# Patient Record
Sex: Female | Born: 1970 | Race: Black or African American | Hispanic: No | Smoking: Never smoker
Health system: Southern US, Community
[De-identification: ages and names within clinical notes are randomized; demographics above are authoritative.]

## PROBLEM LIST (undated history)

## (undated) DIAGNOSIS — J45909 Unspecified asthma, uncomplicated: Secondary | ICD-10-CM

## (undated) HISTORY — PX: APPENDECTOMY: SHX54

## (undated) HISTORY — PX: TUBAL LIGATION: SHX77

## (undated) HISTORY — PX: OVARIAN CYST REMOVAL: SHX89

---

## 2015-05-28 ENCOUNTER — Emergency Department: Payer: Medicaid Other

## 2015-05-28 ENCOUNTER — Encounter: Payer: Self-pay | Admitting: Emergency Medicine

## 2015-05-28 ENCOUNTER — Emergency Department
Admission: EM | Admit: 2015-05-28 | Discharge: 2015-05-28 | Disposition: A | Discharge status: Home / Self Care | Payer: Medicaid Other | Attending: Emergency Medicine | Admitting: Emergency Medicine

## 2015-05-28 DIAGNOSIS — Y9389 Activity, other specified: Secondary | ICD-10-CM | POA: Insufficient documentation

## 2015-05-28 DIAGNOSIS — Y998 Other external cause status: Secondary | ICD-10-CM | POA: Insufficient documentation

## 2015-05-28 DIAGNOSIS — S4992XA Unspecified injury of left shoulder and upper arm, initial encounter: Secondary | ICD-10-CM | POA: Insufficient documentation

## 2015-05-28 DIAGNOSIS — R300 Dysuria: Secondary | ICD-10-CM | POA: Insufficient documentation

## 2015-05-28 DIAGNOSIS — S199XXA Unspecified injury of neck, initial encounter: Secondary | ICD-10-CM | POA: Insufficient documentation

## 2015-05-28 DIAGNOSIS — S0990XA Unspecified injury of head, initial encounter: Secondary | ICD-10-CM | POA: Diagnosis present

## 2015-05-28 DIAGNOSIS — F0781 Postconcussional syndrome: Secondary | ICD-10-CM

## 2015-05-28 DIAGNOSIS — S4991XA Unspecified injury of right shoulder and upper arm, initial encounter: Secondary | ICD-10-CM | POA: Insufficient documentation

## 2015-05-28 DIAGNOSIS — Y92410 Unspecified street and highway as the place of occurrence of the external cause: Secondary | ICD-10-CM | POA: Insufficient documentation

## 2015-05-28 DIAGNOSIS — Z3202 Encounter for pregnancy test, result negative: Secondary | ICD-10-CM | POA: Insufficient documentation

## 2015-05-28 DIAGNOSIS — S060X9A Concussion with loss of consciousness of unspecified duration, initial encounter: Secondary | ICD-10-CM | POA: Insufficient documentation

## 2015-05-28 LAB — URINALYSIS COMPLETE WITH MICROSCOPIC (ARMC ONLY)
Bilirubin Urine: NEGATIVE
Glucose, UA: NEGATIVE mg/dL
Hgb urine dipstick: NEGATIVE
Ketones, ur: NEGATIVE mg/dL
Leukocytes, UA: NEGATIVE
Nitrite: NEGATIVE
PH: 6 (ref 5.0–8.0)
PROTEIN: NEGATIVE mg/dL
Specific Gravity, Urine: 1.019 (ref 1.005–1.030)

## 2015-05-28 LAB — POCT PREGNANCY, URINE: PREG TEST UR: NEGATIVE

## 2015-05-28 MED ORDER — CIPROFLOXACIN HCL 500 MG PO TABS
500.0000 mg | ORAL_TABLET | Freq: Two times a day (BID) | ORAL | Status: AC
Start: 2015-05-28 — End: 2015-06-07

## 2015-05-28 MED ORDER — BUTALBITAL-APAP-CAFFEINE 50-325-40 MG PO TABS
2.0000 | ORAL_TABLET | Freq: Once | ORAL | Status: AC
Start: 2015-05-28 — End: 2015-05-28
  Administered 2015-05-28: 2 via ORAL

## 2015-05-28 MED ORDER — PROMETHAZINE HCL 25 MG PO TABS: 25.0000 mg | ORAL_TABLET | Freq: Four times a day (QID) | ORAL | Status: AC | PRN

## 2015-05-28 MED ORDER — BUTALBITAL-APAP-CAFFEINE 50-325-40 MG PO TABS: 1.0000 | ORAL_TABLET | Freq: Four times a day (QID) | ORAL | Status: AC | PRN

## 2015-05-28 MED ORDER — BUTALBITAL-APAP-CAFFEINE 50-325-40 MG PO TABS
ORAL_TABLET | ORAL | Status: AC
Start: 2015-05-28 — End: 2015-05-28
  Administered 2015-05-28: 2 via ORAL
  Filled 2015-05-28: qty 2
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
## 2015-05-28 NOTE — Discharge Instructions (Signed)
Post-Concussion Syndrome Post-concussion syndrome is the symptoms that can occur after a head injury. These symptoms can last from weeks to months. HOME CARE   Take medicines only as told by your doctor.  Do not take aspirin.  Sleep with your head raised to help with headaches.  Avoid activities that can cause another head injury.  Do not play contact sports like football, hockey, soccer, or basketball.  Do not do other risky activities like downhill skiing, martial arts, or horseback riding until your doctor says it is okay.  Keep all follow-up visits as told by your doctor. This is important. GET HELP IF:   You have a harder time:  Paying attention.  Focusing.  Remembering.  Learning new information.  Dealing with stress.  You need more time to complete tasks.  You are easily bothered (irritable).  You have more symptoms. Get help if you have any of these symptoms for more than two weeks after your injury:   Long-lasting (chronic) headaches.  Dizziness.  Trouble balancing.  Feeling sick to your stomach (nauseous).  Trouble with your vision.  Noise or light bothers you more.  Depression.  Mood swings.  Feeling worried (anxious).  Easily bothered.  Memory problems.  Trouble concentrating or paying attention.  Sleep problems.  Feeling tired all of the time. GET HELP RIGHT AWAY IF:  You feel confused.  You feel very sleepy.  You are hard to wake up.  You feel sick to your stomach.  You keep throwing up (vomiting).  You feel like you are moving when you are not (vertigo).  Your eyes move back and forth very quickly.  You start shaking (convulsing) or pass out (faint).  You have very bad headaches that do not get better with medicine.  You cannot use your arms or legs like normal.  One of the black centers of your eyes (pupils) is bigger than the other.  You have clear or bloody fluid coming from your nose or ears.  Your problems  get worse, not better. MAKE SURE YOU:  Understand these instructions.  Will watch your condition.  Will get help right away if you are not doing well or get worse.   This information is not intended to replace advice given to you by your health care provider. Make sure you discuss any questions you have with your health care provider.   Document Released: 06/04/2004 Document Revised: 05/18/2014 Document Reviewed: 08/02/2013 Elsevier Interactive Patient Education 2016 [employer]  General Headache Without Cause A headache is pain or discomfort felt around the head or neck area. There are many causes and types of headaches. In some cases, the cause may not be found.  HOME CARE  Managing Pain  Take over-the-counter and prescription medicines only as told by your doctor.  Lie down in a dark, quiet room when you have a headache.  If directed, apply ice to the head and neck area:  Put ice in a plastic bag.  Place a towel between your skin and the bag.  Leave the ice on for 20 minutes, 2-3 times per day.  Use a heating pad or hot shower to apply heat to the head and neck area as told by your doctor.  Keep lights dim if bright lights bother you or make your headaches worse. Eating and Drinking  Eat meals on a regular schedule.  Lessen how much alcohol you drink.  Lessen how much caffeine you drink, or stop drinking caffeine. General Instructions  Keep all  follow-up visits as told by your doctor. This is important.  Keep a journal to find out if certain things bring on headaches. For example, write down:  What you eat and drink.  How much sleep you get.  Any change to your diet or medicines.  Relax by getting a massage or doing other relaxing activities.  Lessen stress.  Sit up straight. Do not tighten (tense) your muscles.  Do not use tobacco products. This includes cigarettes, chewing tobacco, or e-cigarettes. If you need help quitting, ask your  doctor.  Exercise regularly as told by your doctor.  Get enough sleep. This often means 7-9 hours of sleep. GET HELP IF:  Your symptoms are not helped by medicine.  You have a headache that feels different than the other headaches.  You feel sick to your stomach (nauseous) or you throw up (vomit).  You have a fever. GET HELP RIGHT AWAY IF:   Your headache becomes really bad.  You keep throwing up.  You have a stiff neck.  You have trouble seeing.  You have trouble speaking.  You have pain in the eye or ear.  Your muscles are weak or you lose muscle control.  You lose your balance or have trouble walking.  You feel like you will pass out (faint) or you pass out.  You have confusion.   This information is not intended to replace advice given to you by your health care provider. Make sure you discuss any questions you have with your health care provider.   Document Released: 02/04/2008 Document Revised: 01/16/2015 Document Reviewed: 08/20/2014 Elsevier Interactive Patient Education [employer]
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
## 2015-05-28 NOTE — ED Notes (Signed)
AAOx3.  Skin warm and dry.  Ambulates with easy and steady gait.  Moving all extremities equally and strong. 
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
## 2015-05-30 LAB — URINE CULTURE

## 2015-11-15 ENCOUNTER — Emergency Department
Admission: EM | Admit: 2015-11-15 | Discharge: 2015-11-15 | Disposition: A | Discharge status: Home / Self Care | Payer: No Typology Code available for payment source | Attending: Emergency Medicine | Admitting: Emergency Medicine

## 2015-11-15 ENCOUNTER — Emergency Department: Payer: No Typology Code available for payment source

## 2015-11-15 ENCOUNTER — Encounter: Payer: Self-pay | Admitting: *Deleted

## 2015-11-15 DIAGNOSIS — Y929 Unspecified place or not applicable: Secondary | ICD-10-CM | POA: Insufficient documentation

## 2015-11-15 DIAGNOSIS — Y999 Unspecified external cause status: Secondary | ICD-10-CM | POA: Insufficient documentation

## 2015-11-15 DIAGNOSIS — S9032XA Contusion of left foot, initial encounter: Secondary | ICD-10-CM | POA: Insufficient documentation

## 2015-11-15 DIAGNOSIS — Y9389 Activity, other specified: Secondary | ICD-10-CM | POA: Insufficient documentation

## 2015-11-15 DIAGNOSIS — W208XXA Other cause of strike by thrown, projected or falling object, initial encounter: Secondary | ICD-10-CM | POA: Insufficient documentation

## 2015-11-15 MED ORDER — MELOXICAM 15 MG PO TABS: 15.0000 mg | ORAL_TABLET | Freq: Every day | ORAL | Status: AC

## 2015-11-15 NOTE — ED Notes (Signed)
Pt to ED after dropping large fan on left foot yesterday. Pt ambulatory to room with NAD, pedal pulses present and intact. NAD noted.

## 2015-11-15 NOTE — ED Notes (Signed)
See triage note  States she dropped a fan onto left foot yesterday conts to have pain positive pulses

## 2015-11-15 NOTE — Discharge Instructions (Signed)

## 2015-11-15 NOTE — ED Provider Notes (Signed)
[facility]  ____________________________________________  Time seen: Approximately 5:12 PM  I have reviewed the triage vital signs and the nursing notes.   HISTORY  Chief Complaint Foot Injury    HPI Kathy Brooks is a 45 y.o. female who presents to emergency department complaining of left foot pain. Patient states that she was moving a oscillating fan when it slipped out of foot. Patient reports pain to being dorsal aspect of the foot and the lateral aspect. Patient reports it is extremely painful with weightbearing. Pain is constant, worse with ambulation, moderate to severe. Patient has not tried any medications for this complaint prior to arrival.   History reviewed. No pertinent past medical history.  There are no active problems to display for this patient.   Past Surgical History  Procedure Laterality Date  . Tubal ligation    . Appendectomy    . Ovarian cyst removal      Current Outpatient Rx  Name  Route  Sig  Dispense  Refill  . butalbital-acetaminophen-caffeine (FIORICET) 50-325-40 MG tablet   Oral   Take 1-2 tablets by mouth every 6 (six) hours as needed for headache.   20 tablet   0   . meloxicam (MOBIC) 15 MG tablet   Oral   Take 1 tablet (15 mg total) by mouth daily.   30 tablet   0   . promethazine (PHENERGAN) 25 MG tablet   Oral   Take 1 tablet (25 mg total) by mouth every 6 (six) hours as needed for nausea or vomiting.   15 tablet   0     Allergies Rocephin  History reviewed. No pertinent family history.  Social History Social History  Substance Use Topics  . Smoking status: Never Smoker   . Smokeless tobacco: None  . Alcohol Use: No     Review of Systems  Constitutional: No fever/chills Cardiovascular: no chest pain. Respiratory: no cough. No SOB. Musculoskeletal: Positive for left foot pain Skin: Negative for rash, abrasions, lacerations,  ecchymosis. Neurological: Negative for headaches, focal weakness or numbness. 10-point ROS otherwise negative.  ____________________________________________   PHYSICAL EXAM:  VITAL SIGNS: ED Triage Vitals  Enc Vitals Group     BP 11/15/15 1622 118/75 mmHg     Pulse Rate 11/15/15 1622 71     Resp 11/15/15 1622 18     Temp 11/15/15 1622 98.6 F (37 C)     Temp Source 11/15/15 1622 Oral     SpO2 11/15/15 1622 100 %     Weight 11/15/15 1622 118 lb (53.524 kg)     Height 11/15/15 1622 5\' 3"  (1.6 m)     Head Cir --      Peak Flow --      Pain Score 11/15/15 1622 6     Pain Loc --      Pain Edu? --      Excl. in GC? --      Constitutional: Alert and oriented. Well appearing and in no acute distress. Eyes: Conjunctivae are normal. PERRL. EOMI. Head: Atraumatic. Neck: No stridor.    Cardiovascular: Normal rate, regular rhythm. Normal S1 and S2.  Good peripheral circulation. Respiratory: Normal respiratory effort without tachypnea or retractions. Lungs CTAB. Good air entry to the bases with no decreased or absent breath sounds. Musculoskeletal: Full range of motion to all extremities. No gross deformities appreciated.No visible deformity to left foot on inspection. No ecchymosis or contusions noted. Patient is diffusely tender to palpation  over the lateral tarsal bones of the left foot. No palpable abnormality. Full range of motion all digits of foot. Sensation intact 5 digits of the left foot. Dorsalis pedis pulse intact. Neurologic:  Normal speech and language. No gross focal neurologic deficits are appreciated.  Skin:  Skin is warm, dry and intact. No rash noted. Psychiatric: Mood and affect are normal. Speech and behavior are normal. Patient exhibits appropriate insight and judgement.   ____________________________________________   LABS (all labs ordered are listed, but only abnormal results are displayed)  Labs Reviewed - No data to  display ____________________________________________  EKG   ____________________________________________  RADIOLOGY Festus BarrenI, Jonathan D Cuthriell, personally viewed and evaluated these images (plain radiographs) as part of my medical decision making, as well as reviewing the written report by the radiologist.  Dg Foot Complete Left  11/15/2015  CLINICAL DATA:  Direct trauma to the left foot when a fence fell on it. Patient reports symptoms greatest at the base of the fourth toe. There is swelling clinically. EXAM: LEFT FOOT - COMPLETE 3+ VIEW COMPARISON:  None in PACs FINDINGS: The bones are subjectively adequately mineralized. There is no acute fracture nor dislocation. There is mild interphalangeal joint space narrowing without erosive change. The metatarsals and tarsals are intact. There is a tiny plantar calcaneal spur. The soft tissues are unremarkable. IMPRESSION: There is no acute fracture nor dislocation of the bones of the left foot. Electronically Signed   By: David  Swazil[place] M.D.   On: 11/15/2015 16:58    ____________________________________________    PROCEDURES  Procedure(s) performed:       Medications - No data to display   ____________________________________________   INITIAL IMPRESSION / ASSESSMENT AND PLAN / ED COURSE  Pertinent labs & imaging results that were available during my care of the patient were reviewed by me and considered in my medical decision making (see chart for details).  Patient's diagnosis is consistent with left foot contusion. X-ray reveals no acute osseous abnormality. Patient will be given crutches for an evaluation. Patient will be discharged home with prescriptions for anti-inflammatories for symptom control. Patient is to follow up with primary care provider as needed or otherwise directed. Patient is given ED precautions to return to the ED for any worsening or new symptoms.     ____________________________________________  FINAL  CLINICAL IMPRESSION(S) / ED DIAGNOSES  Final diagnoses:  Foot contusion, left, initial encounter      NEW MEDICATIONS STARTED DURING THIS VISIT:  New Prescriptions   MELOXICAM (MOBIC) 15 MG TABLET    Take 1 tablet (15 mg total) by mouth daily.        This chart was dictated using voice recognition software/Dragon. Despite best efforts to proofread, errors can occur which can change the meaning. Any change was purely unintentional.    Racheal PatchesJonathan D Cuthriell, PA-C 11/15/15 1719  Minna AntisKevin Paduchowski, MD 11/15/15 2325

## 2016-04-29 ENCOUNTER — Encounter: Payer: Self-pay | Admitting: Emergency Medicine

## 2016-04-29 ENCOUNTER — Emergency Department
Admission: EM | Admit: 2016-04-29 | Discharge: 2016-04-29 | Disposition: A | Discharge status: Home / Self Care | Payer: Self-pay | Attending: Emergency Medicine | Admitting: Emergency Medicine

## 2016-04-29 ENCOUNTER — Emergency Department: Payer: Self-pay

## 2016-04-29 DIAGNOSIS — R42 Dizziness and giddiness: Secondary | ICD-10-CM | POA: Insufficient documentation

## 2016-04-29 DIAGNOSIS — J45909 Unspecified asthma, uncomplicated: Secondary | ICD-10-CM | POA: Insufficient documentation

## 2016-04-29 DIAGNOSIS — R112 Nausea with vomiting, unspecified: Secondary | ICD-10-CM | POA: Insufficient documentation

## 2016-04-29 HISTORY — DX: Unspecified asthma, uncomplicated: J45.909

## 2016-04-29 LAB — LIPASE, BLOOD: Lipase: 32 U/L (ref 11–51)

## 2016-04-29 LAB — URINALYSIS, COMPLETE (UACMP) WITH MICROSCOPIC
BACTERIA UA: NONE SEEN
Bilirubin Urine: NEGATIVE
Glucose, UA: NEGATIVE mg/dL
Hgb urine dipstick: NEGATIVE
Ketones, ur: NEGATIVE mg/dL
Leukocytes, UA: NEGATIVE
Nitrite: NEGATIVE
Protein, ur: NEGATIVE mg/dL
SPECIFIC GRAVITY, URINE: 1.023 (ref 1.005–1.030)
pH: 6 (ref 5.0–8.0)

## 2016-04-29 LAB — CBC WITH DIFFERENTIAL/PLATELET
BASOS PCT: 1 %
Basophils Absolute: 0 10*3/uL (ref 0–0.1)
Eosinophils Absolute: 0.1 10*3/uL (ref 0–0.7)
Eosinophils Relative: 3 %
HEMATOCRIT: 34.8 % — AB (ref 35.0–47.0)
Hemoglobin: 11.6 g/dL — ABNORMAL LOW (ref 12.0–16.0)
Lymphocytes Relative: 27 %
Lymphs Abs: 1 10*3/uL (ref 1.0–3.6)
MCH: 28.6 pg (ref 26.0–34.0)
MCHC: 33.3 g/dL (ref 32.0–36.0)
MCV: 85.8 fL (ref 80.0–100.0)
MONO ABS: 0.3 10*3/uL (ref 0.2–0.9)
MONOS PCT: 10 %
NEUTROS ABS: 2.1 10*3/uL (ref 1.4–6.5)
Neutrophils Relative %: 59 %
Platelets: 217 10*3/uL (ref 150–440)
RBC: 4.06 MIL/uL (ref 3.80–5.20)
RDW: 14.2 % (ref 11.5–14.5)
WBC: 3.5 10*3/uL — ABNORMAL LOW (ref 3.6–11.0)

## 2016-04-29 LAB — COMPREHENSIVE METABOLIC PANEL
ALK PHOS: 44 U/L (ref 38–126)
ALT: 9 U/L — AB (ref 14–54)
AST: 18 U/L (ref 15–41)
Albumin: 3.9 g/dL (ref 3.5–5.0)
Anion gap: 4 — ABNORMAL LOW (ref 5–15)
BILIRUBIN TOTAL: 0.3 mg/dL (ref 0.3–1.2)
BUN: 13 mg/dL (ref 6–20)
CALCIUM: 8.9 mg/dL (ref 8.9–10.3)
CHLORIDE: 110 mmol/L (ref 101–111)
CO2: 26 mmol/L (ref 22–32)
CREATININE: 0.7 mg/dL (ref 0.44–1.00)
GFR calc Af Amer: >60 mL/min (ref >60)
Glucose, Bld: 93 mg/dL (ref 65–99)
Potassium: 3.8 mmol/L (ref 3.5–5.1)
Sodium: 140 mmol/L (ref 135–145)
Total Protein: 7.6 g/dL (ref 6.5–8.1)

## 2016-04-29 LAB — LACTIC ACID, PLASMA: LACTIC ACID, VENOUS: 1.2 mmol/L (ref 0.5–1.9)

## 2016-04-29 LAB — POCT PREGNANCY, URINE: Preg Test, Ur: NEGATIVE

## 2016-04-29 LAB — PREGNANCY, URINE: PREG TEST UR: NEGATIVE

## 2016-04-29 MED ORDER — ONDANSETRON 4 MG PO TBDP: 4.0000 mg | ORAL_TABLET | Freq: Three times a day (TID) | ORAL | 0 refills | Status: AC | PRN

## 2016-04-29 MED ORDER — SODIUM CHLORIDE 0.9 % IV SOLN
Freq: Once | INTRAVENOUS | Status: AC
  Administered 2016-04-29: 12:00:00 via INTRAVENOUS

## 2016-04-29 MED ORDER — MECLIZINE HCL 32 MG PO TABS: ORAL_TABLET | ORAL | 0 refills | Status: AC

## 2016-04-29 MED ORDER — DIAZEPAM 5 MG/ML IJ SOLN: 2.5000 mg | Freq: Once | INTRAMUSCULAR | Status: DC

## 2016-04-29 MED ORDER — KETOROLAC TROMETHAMINE 30 MG/ML IJ SOLN
30.0000 mg | Freq: Once | INTRAMUSCULAR | Status: AC
  Administered 2016-04-29: 30 mg via INTRAVENOUS
  Filled 2016-04-29: qty 1

## 2016-04-29 MED ORDER — DIAZEPAM 2 MG PO TABS: 2.0000 mg | ORAL_TABLET | Freq: Three times a day (TID) | ORAL | 0 refills | Status: AC | PRN

## 2016-04-29 MED ORDER — MECLIZINE HCL 32 MG PO TABS: ORAL_TABLET | ORAL | 0 refills | Status: DC

## 2016-04-29 MED ORDER — LORAZEPAM 2 MG/ML IJ SOLN
1.0000 mg | Freq: Once | INTRAMUSCULAR | Status: AC
  Administered 2016-04-29: 1 mg via INTRAVENOUS

## 2016-04-29 MED ORDER — LORAZEPAM 2 MG/ML IJ SOLN
INTRAMUSCULAR | Status: AC
  Administered 2016-04-29: 1 mg via INTRAVENOUS
  Filled 2016-04-29: qty 1

## 2016-04-29 MED ORDER — LORAZEPAM 2 MG/ML IJ SOLN
INTRAMUSCULAR | Status: AC
  Filled 2016-04-29: qty 1

## 2016-04-29 MED ORDER — ONDANSETRON HCL 4 MG/2ML IJ SOLN
4.0000 mg | Freq: Once | INTRAMUSCULAR | Status: AC
  Administered 2016-04-29: 4 mg via INTRAVENOUS
  Filled 2016-04-29: qty 2

## 2016-04-29 MED ORDER — MECLIZINE HCL 25 MG PO TABS
50.0000 mg | ORAL_TABLET | Freq: Once | ORAL | Status: AC
  Administered 2016-04-29: 50 mg via ORAL
  Filled 2016-04-29: qty 2

## 2016-04-29 MED ORDER — SODIUM CHLORIDE 0.9 % IV SOLN: Freq: Once | INTRAVENOUS | Status: DC

## 2016-04-29 NOTE — ED Provider Notes (Signed)
[facility] Emergency Department Provider Note   ____________________________________________   First MD Initiated Contact with Patient 04/29/16 1129     (approximate)  I have reviewed the triage vital signs and the nursing notes.   HISTORY  Chief Complaint Emesis; Chest Pain; and Dizziness    HPI Kathy Brooks is a 45 y.o. female patient reports onset of vertigo spinning with head movement 3 days ago. She's had a lot of nausea since then. Vomiting frequently. No diarrhea no fever no headache patient's vomitus so much and now her chest hurts. Chest pain is in the left chest just lateral to the breast bone palpation of the area reproduces her pain. No abdominal pain turning her head to the left reproduces her symptoms of vertigo and nausea.       Past Medical History:  Diagnosis Date  . Asthma     There are no active problems to display for this patient.   Past Surgical History:  Procedure Laterality Date  . APPENDECTOMY    . OVARIAN CYST REMOVAL    . TUBAL LIGATION      Prior to Admission medications   Medication Sig Start Date End Date Taking? Authorizing Provider  meclizine (ANTIVERT) 25 MG tablet Take 25 mg by mouth 2 (two) times daily.   Yes Historical Provider, MD  promethazine (PHENERGAN) 25 MG tablet Take 1 tablet (25 mg total) by mouth every 6 (six) hours as needed for nausea or vomiting. 05/28/15  Yes Minna AntisKevin Paduchowski, MD  butalbital-acetaminophen-caffeine (FIORICET) [phone] MG tablet Take 1-2 tablets by mouth every 6 (six) hours as needed for headache. Patient not taking: Reported on 04/29/2016 05/28/15 05/27/16  Minna AntisKevin Paduchowski, MD  meloxicam (MOBIC) 15 MG tablet Take 1 tablet (15 mg total) by mouth daily. Patient not taking: Reported on 04/29/2016 11/15/15   Delorise RoyalsJonathan D Cuthriell, PA-C    Allergies Rocephin [ceftriaxone]  No family history on file.  Social History Social History  Substance Use Topics  . Smoking status:  Never Smoker  . Smokeless tobacco: Not on file  . Alcohol use No    Review of Systems Constitutional: No fever/chills Eyes: No visual changes. ENT: No sore throat. Cardiovascula chest pain. Respiratory: Denies shortness of breath. Gastrointestinal: No abdominal pain.  No nausea, no vomiting.  No diarrhea.  No constipation. Genitourinary: Negative for dysuria. Musculoskeletal: Negative for back pain. Skin: Negative for rash. Neurological: Negative for headaches, focal weakness or numbness.  10-point ROS otherwise negative.  ____________________________________________   PHYSICAL EXAM:  VITAL SIGNS: ED Triage Vitals  Enc Vitals Group     BP 04/29/16 1059 102/77     Pulse Rate 04/29/16 1059 87     Resp 04/29/16 1059 20     Temp 04/29/16 1059 98.7 F (37.1 C)     Temp Source 04/29/16 1059 Oral     SpO2 04/29/16 1059 99 %     Weight 04/29/16 1059 118 lb (53.5 kg)     Height 04/29/16 1059 5\' 3"  (1.6 m)     Head Circumference --      Peak Flow --      Pain Score 04/29/16 1100 8     Pain Loc --      Pain Edu? --      Excl. in GC? --    Constitutional: Alert and oriented. Well appearing and in no acute distress. Eyes: Conjunctivae are normal. PERRL. EOMI.No nystagmus seen Head: Atraumatic. Nose: No congestion/rhinnorhea. Mouth/Throat: Mucous membranes are moist.  Oropharynx non-erythematous.  Neck: No stridor.   Cardiovascular: Normal rate, regular rhythm. Grossly normal heart sounds.  Good peripheral circulation. Palpation of the chest to the left side of the breastbone exactly reproduces the patient's chest pain. Respiratory: Normal respiratory effort.  No retractions. Lungs CTAB. Gastrointestinal: Soft and nontender. No distention. No abdominal bruits. No CVA tenderness. Musculoskeletal: No lower extremity tenderness nor edema.  No joint effusions. Neurologic:  Normal speech and language. No gross focal neurologic deficits are appreciated.  2 through 12 are intact  cerebellar finger-nose is normal motor strength is 5 over 5 throughout sensation is intact throughout. Movement of the head to the left reproduces her symptoms exactly. She does not have any demonstrate full nystagmus however head thrust testing shows no eye deviation Skin:  Skin is warm, dry and intact. No rash noted. Psychiatric: Mood and affect are normal. Speech and behavior are normal.  ____________________________________________   LABS (all labs ordered are listed, but only abnormal results are displayed)  Labs Reviewed  COMPREHENSIVE METABOLIC PANEL - Abnormal; Notable for the following:       Result Value   ALT 9 (*)    Anion gap 4 (*)    All other components within normal limits  CBC WITH DIFFERENTIAL/PLATELET - Abnormal; Notable for the following:    WBC 3.5 (*)    Hemoglobin 11.6 (*)    HCT 34.8 (*)    All other components within normal limits  URINALYSIS, COMPLETE (UACMP) WITH MICROSCOPIC - Abnormal; Notable for the following:    Color, Urine YELLOW (*)    APPearance CLEAR (*)    Squamous Epithelial / LPF 0-5 (*)    All other components within normal limits  LACTIC ACID, PLASMA  LIPASE, BLOOD  PREGNANCY, URINE  POCT PREGNANCY, URINE   ____________________________________________  EKG  EKG read and interpreted by me shows normal sinus rhythm rate of 76 normal axis acute ST-T wave changes ____________________________________________  RADIOLOGY  Study Result   CLINICAL DATA:  Vomiting and dizziness for 3 days. LEFT chest pain. Evaluate new onset vertigo.  EXAM: CT HEAD WITHOUT CONTRAST  TECHNIQUE: Contiguous axial images were obtained from the base of the skull through the vertex without intravenous contrast.  COMPARISON:  CT HEAD May 28, 2015  FINDINGS: BRAIN: The ventricles and sulci are normal. No intraparenchymal hemorrhage, mass effect nor midline shift. No acute large vascular territory infarcts. 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 No abnormal extra-axial fluid collections. Basal cisterns are patent.  VASCULAR: Mild calcific atherosclerosis of the carotid siphons.  SKULL/SOFT TISSUES: No skull fracture. No significant soft tissue swelling.  ORBITS/SINUSES: The included ocular globes and orbital contents are normal.The mastoid aircells and included paranasal sinuses are well-aerated.  OTHER: None.  IMPRESSION: No acute intracranial process.  Old LEFT cerebellar infarct.  Mild atherosclerosis.   Electronically Signed   By: Awilda Metroourtnay  Bloomer M.D.   On: 04/29/2016 13:37    Study Result   CLINICAL DATA:  Vertigo  EXAM: MRI HEAD WITHOUT CONTRAST  TECHNIQUE: Multiplanar, multiecho pulse sequences of the brain and surrounding structures were obtained without intravenous contrast.  COMPARISON:  CT head 04/29/2016  FINDINGS: Brain: Negative for acute infarct. 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 No other areas of chronic ischemia. Negative for demyelinating disease. Negative for hemorrhage or mass.  Ventricle size is normal.  No shift of the midline structures.  Vascular: Normal arterial flow voids.  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 No acute abnormality.   Electronically Signed   By: Marlan Palau M.D.   On: 04/29/2016 16:09    Study Result   CLINICAL DATA:  45 year old female with a history of vomiting for 3 days  EXAM: PORTABLE CHEST 1 VIEW  COMPARISON:  None.  FINDINGS: Cardiomediastinal silhouette within normal limits.  No evidence of central vascular congestion.  No pneumothorax.  No pleural effusion or confluent airspace disease.  No displaced fracture.  IMPRESSION: No radiographic evidence of acute cardiopulmonary disease.  Signed,  Yvone Neu. Loreta Ave,  DO  [facility]  [facility]      ____________________________________________   PROCEDURES  Procedure(s) performed:  Procedures  Critical Care performed:  ____________________________________________   INITIAL IMPRESSION / ASSESSMENT AND PLAN / ED COURSE  Pertinent labs & imaging results that were available during my care of the patient were reviewed by me and considered in my medical decision making (see chart for details).    Clinical Course      ____________________________________________   FINAL CLINICAL IMPRESSION(S) / ED DIAGNOSES  Final diagnoses:  Vertigo      NEW MEDICATIONS STARTED DURING THIS VISIT:  New Prescriptions   No medications on file     Note:  This document was prepared using Dragon voice recognition software and may include unintentional dictation errors.    Arnaldo Natal, MD 04/29/16 [phone]

## 2016-04-29 NOTE — ED Notes (Signed)
Patient is in MRI at this time

## 2016-04-29 NOTE — ED Triage Notes (Signed)
Pt reports vomiting for three days with dizziness. Pt reports non radiating left side chest pain that started last night. Pt denies diarrhea.

## 2016-04-29 NOTE — Discharge Instructions (Signed)
Take the Antivert one pill 3-4 times a day to help with the dizziness. You can use the Zofran melt on your tongue up to 3 times a day if needed for nausea. You can also try the Valium up to 3 times a day for a few days this may help a lot. Please return if you're worse or no better in 3-4 days or follow up with your doctor. If you don't have a regular doctor you can try following up with [facility] especially the acute care wheeze walk-in. You can also see the [facility], the [facility], the [facility], the [facility] or [facility].

## 2016-12-02 ENCOUNTER — Emergency Department
Admission: EM | Admit: 2016-12-02 | Discharge: 2016-12-02 | Disposition: A | Discharge status: Home / Self Care | Payer: Medicaid Other | Attending: Emergency Medicine | Admitting: Emergency Medicine

## 2016-12-02 ENCOUNTER — Encounter: Payer: Self-pay | Admitting: Emergency Medicine

## 2016-12-02 ENCOUNTER — Emergency Department: Payer: Medicaid Other

## 2016-12-02 DIAGNOSIS — H538 Other visual disturbances: Secondary | ICD-10-CM | POA: Insufficient documentation

## 2016-12-02 DIAGNOSIS — Y9389 Activity, other specified: Secondary | ICD-10-CM | POA: Insufficient documentation

## 2016-12-02 DIAGNOSIS — Z79899 Other long term (current) drug therapy: Secondary | ICD-10-CM | POA: Insufficient documentation

## 2016-12-02 DIAGNOSIS — S060X9A Concussion with loss of consciousness of unspecified duration, initial encounter: Secondary | ICD-10-CM | POA: Insufficient documentation

## 2016-12-02 DIAGNOSIS — Y998 Other external cause status: Secondary | ICD-10-CM | POA: Insufficient documentation

## 2016-12-02 DIAGNOSIS — J45909 Unspecified asthma, uncomplicated: Secondary | ICD-10-CM | POA: Insufficient documentation

## 2016-12-02 DIAGNOSIS — Y929 Unspecified place or not applicable: Secondary | ICD-10-CM | POA: Insufficient documentation

## 2016-12-02 LAB — CBC
HCT: 35.6 % (ref 35.0–47.0)
Hemoglobin: 11.5 g/dL — ABNORMAL LOW (ref 12.0–16.0)
MCH: 26.9 pg (ref 26.0–34.0)
MCHC: 32.3 g/dL (ref 32.0–36.0)
MCV: 83.3 fL (ref 80.0–100.0)
PLATELETS: 248 10*3/uL (ref 150–440)
RBC: 4.28 MIL/uL (ref 3.80–5.20)
RDW: 14.8 % — ABNORMAL HIGH (ref 11.5–14.5)
WBC: 5 10*3/uL (ref 3.6–11.0)

## 2016-12-02 LAB — URINALYSIS, COMPLETE (UACMP) WITH MICROSCOPIC
Bacteria, UA: NONE SEEN
Bilirubin Urine: NEGATIVE
GLUCOSE, UA: NEGATIVE mg/dL
Hgb urine dipstick: NEGATIVE
KETONES UR: NEGATIVE mg/dL
Leukocytes, UA: NEGATIVE
Nitrite: NEGATIVE
PH: 5 (ref 5.0–8.0)
Protein, ur: NEGATIVE mg/dL
Specific Gravity, Urine: 1.028 (ref 1.005–1.030)

## 2016-12-02 LAB — BASIC METABOLIC PANEL
Anion gap: 7 (ref 5–15)
BUN: 13 mg/dL (ref 6–20)
CALCIUM: 8.8 mg/dL — AB (ref 8.9–10.3)
CO2: 23 mmol/L (ref 22–32)
CREATININE: 0.72 mg/dL (ref 0.44–1.00)
Chloride: 110 mmol/L (ref 101–111)
Glucose, Bld: 70 mg/dL (ref 65–99)
Potassium: 3.8 mmol/L (ref 3.5–5.1)
SODIUM: 140 mmol/L (ref 135–145)

## 2016-12-02 LAB — POCT PREGNANCY, URINE: Preg Test, Ur: NEGATIVE

## 2016-12-02 MED ORDER — PROCHLORPERAZINE MALEATE 10 MG PO TABS: 10.0000 mg | ORAL_TABLET | Freq: Three times a day (TID) | ORAL | 0 refills | Status: AC | PRN

## 2016-12-02 MED ORDER — PROCHLORPERAZINE EDISYLATE 5 MG/ML IJ SOLN
10.0000 mg | Freq: Once | INTRAMUSCULAR | Status: AC
  Administered 2016-12-02: 10 mg via INTRAVENOUS
  Filled 2016-12-02 (×2): qty 2

## 2016-12-02 MED ORDER — SODIUM CHLORIDE 0.9 % IV BOLUS (SEPSIS)
1000.0000 mL | Freq: Once | INTRAVENOUS | Status: AC
  Administered 2016-12-02: 1000 mL via INTRAVENOUS
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
## 2016-12-02 NOTE — ED Triage Notes (Signed)
Pt reports back of head numbness, bilateral fingertip numbness and blurred vision. Also reports sx of vertigo, reports hx of the same. Pt reports taking antivert without relief. Pt reports sx x3 days.
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
## 2016-12-02 NOTE — Discharge Instructions (Signed)
Please seek medical attention for any high fevers, chest pain, shortness of breath, change in behavior, persistent vomiting, bloody stool or any other new or concerning symptoms.  

## 2017-06-08 ENCOUNTER — Emergency Department
Admission: EM | Admit: 2017-06-08 | Discharge: 2017-06-08 | Disposition: A | Discharge status: Home / Self Care | Payer: Self-pay | Attending: Emergency Medicine | Admitting: Emergency Medicine

## 2017-06-08 ENCOUNTER — Other Ambulatory Visit: Payer: Self-pay

## 2017-06-08 ENCOUNTER — Encounter: Payer: Self-pay | Admitting: Emergency Medicine

## 2017-06-08 DIAGNOSIS — R21 Rash and other nonspecific skin eruption: Secondary | ICD-10-CM | POA: Insufficient documentation

## 2017-06-08 DIAGNOSIS — J45909 Unspecified asthma, uncomplicated: Secondary | ICD-10-CM | POA: Insufficient documentation

## 2017-06-08 DIAGNOSIS — Z79899 Other long term (current) drug therapy: Secondary | ICD-10-CM | POA: Insufficient documentation

## 2017-06-08 LAB — CBC WITH DIFFERENTIAL/PLATELET
Basophils Absolute: 0 10*3/uL (ref 0–0.1)
Basophils Relative: 0 %
Eosinophils Absolute: 0.4 10*3/uL (ref 0–0.7)
Eosinophils Relative: 6 %
HCT: 36.8 % (ref 35.0–47.0)
Hemoglobin: 11.8 g/dL — ABNORMAL LOW (ref 12.0–16.0)
Lymphocytes Relative: 20 %
Lymphs Abs: 1.2 10*3/uL (ref 1.0–3.6)
MCH: 26.8 pg (ref 26.0–34.0)
MCHC: 31.9 g/dL — ABNORMAL LOW (ref 32.0–36.0)
MCV: 83.9 fL (ref 80.0–100.0)
Monocytes Absolute: 0.6 10*3/uL (ref 0.2–0.9)
Monocytes Relative: 10 %
Neutro Abs: 3.7 10*3/uL (ref 1.4–6.5)
Neutrophils Relative %: 64 %
Platelets: 281 10*3/uL (ref 150–440)
RBC: 4.39 MIL/uL (ref 3.80–5.20)
RDW: 14.8 % — ABNORMAL HIGH (ref 11.5–14.5)
WBC: 5.8 10*3/uL (ref 3.6–11.0)

## 2017-06-08 LAB — COMPREHENSIVE METABOLIC PANEL
ALT: 15 U/L (ref 14–54)
AST: 24 U/L (ref 15–41)
Albumin: 4 g/dL (ref 3.5–5.0)
Alkaline Phosphatase: 50 U/L (ref 38–126)
Anion gap: 7 (ref 5–15)
BUN: 13 mg/dL (ref 6–20)
CO2: 24 mmol/L (ref 22–32)
Calcium: 8.8 mg/dL — ABNORMAL LOW (ref 8.9–10.3)
Chloride: 109 mmol/L (ref 101–111)
Creatinine, Ser: 0.72 mg/dL (ref 0.44–1.00)
GFR calc Af Amer: >60 mL/min (ref >60)
GFR calc non Af Amer: >60 mL/min (ref >60)
Glucose, Bld: 87 mg/dL (ref 65–99)
Potassium: 3.6 mmol/L (ref 3.5–5.1)
Sodium: 140 mmol/L (ref 135–145)
Total Bilirubin: 0.5 mg/dL (ref 0.3–1.2)
Total Protein: 7.7 g/dL (ref 6.5–8.1)

## 2017-06-08 MED ORDER — DEXAMETHASONE SODIUM PHOSPHATE 10 MG/ML IJ SOLN
20.0000 mg | Freq: Once | INTRAMUSCULAR | Status: AC
  Administered 2017-06-08: 20 mg via INTRAMUSCULAR
  Filled 2017-06-08: qty 2

## 2017-06-08 MED ORDER — IVERMECTIN 3 MG PO TABS: 200.0000 ug/kg | ORAL_TABLET | Freq: Once | ORAL | 1 refills | Status: AC

## 2017-06-08 MED ORDER — FLUCONAZOLE 150 MG PO TABS: 150.0000 mg | ORAL_TABLET | Freq: Every day | ORAL | 0 refills | Status: AC

## 2017-06-08 MED ORDER — SULFAMETHOXAZOLE-TRIMETHOPRIM 800-160 MG PO TABS: 1.0000 | ORAL_TABLET | Freq: Two times a day (BID) | ORAL | 0 refills | Status: AC

## 2017-06-08 NOTE — ED Provider Notes (Signed)
[facility] Emergency Department Provider Note  ____________________________________________  Time seen: Approximately 7:49 PM  I have reviewed the triage vital signs and the nursing notes.   HISTORY  Chief Complaint Rash    HPI Kathy Brooks is a 47 y.o. female patient presents to the emergency department with rash for the past 11 days.   rash is diffuse across patient's entire body.  Patient first noticed rash starting after she drank orange juice and was on her arm.  Patient sought care with urgent care who prescribed her a 5-day course of prednisone.  Patient reports that rash worsened.  Patient has experienced no recent travel.  No other contacts in the home have similar symptoms.  Patient denies recent illness.  No congestion, rhinorrhea or nonproductive cough.  Patient has been in a monogamous relationship and has no concerns for STDs.  She recalls no new linens, clothing, makeup or other hygiene items.  Patient has not experienced similar rash in the past.  She has not had contact with outdoor foliage.  Patient has been afebrile.   Past Medical History:  Diagnosis Date  . Asthma     There are no active problems to display for this patient.   Past Surgical History:  Procedure Laterality Date  . APPENDECTOMY    . OVARIAN CYST REMOVAL    . TUBAL LIGATION      Prior to Admission medications   Medication Sig Start Date End Date Taking? Authorizing Provider  fluconazole (DIFLUCAN) 150 MG tablet Take 1 tablet (150 mg total) by mouth daily for 28 days. Take one tablet weekly for one month. 06/08/17 07/06/17  Orvil Feil, PA-C  ivermectin (STROMECTOL) 3 MG TABS tablet Take 4 tablets (12,000 mcg total) by mouth once for 1 dose. 06/08/17 06/08/17  Orvil Feil, PA-C  meclizine (ANTIVERT) 25 MG tablet Take 25 mg by mouth 2 (two) times daily.    [provider]  meclizine (ANTIVERT) 32 MG tablet 25 mg  Take 1 pill 3-4 x a day disp 40 tabs  04/29/16   Arnaldo Natal, MD  meloxicam (MOBIC) 15 MG tablet Take 1 tablet (15 mg total) by mouth daily. Patient not taking: Reported on 04/29/2016 11/15/15   Cuthriell, Delorise Royals, PA-C  ondansetron (ZOFRAN ODT) 4 MG disintegrating tablet Take 1 tablet (4 mg total) by mouth every 8 (eight) hours as needed for nausea or vomiting. 04/29/16   Arnaldo Natal, MD  prochlorperazine (COMPAZINE) 10 MG tablet Take 1 tablet (10 mg total) by mouth every 8 (eight) hours as needed (headache). 12/02/16   Phineas Semen, MD  promethazine (PHENERGAN) 25 MG tablet Take 1 tablet (25 mg total) by mouth every 6 (six) hours as needed for nausea or vomiting. 05/28/15   Minna Antis, MD  sulfamethoxazole-trimethoprim (BACTRIM DS,SEPTRA DS) 800-160 MG tablet Take 1 tablet by mouth 2 (two) times daily for 7 days. 06/08/17 06/15/17  Orvil Feil, PA-C    Allergies Rocephin [ceftriaxone]  History reviewed. No pertinent family history.  Social History Social History   Tobacco Use  . Smoking status: Never Smoker  Substance Use Topics  . Alcohol use: No  . Drug use: No     Review of Systems  Constitutional: No fever/chills Eyes: No visual changes. No discharge ENT: No upper respiratory complaints. Cardiovascular: no chest pain. Respiratory: no cough. No SOB. Gastrointestinal: No abdominal pain.  No nausea, no vomiting.  No diarrhea.  No constipation. Genitourinary: Negative for dysuria. No hematuria Musculoskeletal:  Negative for musculoskeletal pain. Skin: Patient has rash.  Neurological: Negative for headaches, focal weakness or numbness.   ____________________________________________   PHYSICAL EXAM:  VITAL SIGNS: ED Triage Vitals  Enc Vitals Group     BP 06/08/17 1659 (!) 126/97     Pulse Rate 06/08/17 1659 95     Resp 06/08/17 1659 18     Temp 06/08/17 1659 97.9 F (36.6 C)     Temp Source 06/08/17 1659 Oral     SpO2 06/08/17 1659 100 %     Weight 06/08/17 1700 126 lb (57.2 kg)      Height 06/08/17 1700 5\' 3"  (1.6 m)     Head Circumference --      Peak Flow --      Pain Score 06/08/17 1700 3     Pain Loc --      Pain Edu? --      Excl. in GC? --      Constitutional: Alert and oriented. Well appearing and in no acute distress. Eyes: Conjunctivae are normal. PERRL. EOMI. Head: Atraumatic. ENT:      Ears: TMs are pearly bilaterally.      Nose: No congestion/rhinnorhea.      Mouth/Throat: Mucous membranes are moist.  Hematological/Lymphatic/Immunilogical: No cervical lymphadenopathy. Cardiovascular: Normal rate, regular rhythm. Normal S1 and S2.  Good peripheral circulation. Respiratory: Normal respiratory effort without tachypnea or retractions. Lungs CTAB. Good air entry to the bases with no decreased or absent breath sounds. Musculoskeletal: Full range of motion to all extremities. No gross deformities appreciated. Neurologic:  Normal speech and language. No gross focal neurologic deficits are appreciated.  Skin: Patient has a diffuse, maculopapular rash with papules and eczematous dermatitis concentrated along the breast and hive-like rash concentrated on the buttocks.   ____________________________________________   LABS (all labs ordered are listed, but only abnormal results are displayed)  Labs Reviewed  CBC WITH DIFFERENTIAL/PLATELET - Abnormal; Notable for the following components:      Result Value   Hemoglobin 11.8 (*)    MCHC 31.9 (*)    RDW 14.8 (*)    All other components within normal limits  COMPREHENSIVE METABOLIC PANEL - Abnormal; Notable for the following components:   Calcium 8.8 (*)    All other components within normal limits  RPR   ____________________________________________  EKG   ____________________________________________  RADIOLOGY  No results found.  ____________________________________________    PROCEDURES  Procedure(s) performed:    Procedures    Medications  dexamethasone (DECADRON) injection 20 mg  (20 mg Intramuscular Given 06/08/17 1829)     ____________________________________________   INITIAL IMPRESSION / ASSESSMENT AND PLAN / ED COURSE  Pertinent labs & imaging results that were available during my care of the patient were reviewed by me and considered in my medical decision making (see chart for details).  Review of the [place] CSRS was performed in accordance of the [facility] prior to dispensing any controlled drugs.     Assessment and plan Rash Differential diagnosis included ITP, contact dermatitis, scabies, secondary syphilis, staph infection and fungal dermatitis.  Patient CBC and CMP were reassuring in the emergency department, decreasing suspicion for ITP.  Patient did not respond to Decadron given in the emergency department, decreasing suspicion for contact dermatitis.  Patient was treated empirically with Diflucan, ivermectin and Bactrim.  Patient was advised to follow up with dermatology.     ____________________________________________  FINAL CLINICAL IMPRESSION(S) / ED DIAGNOSES  Final diagnoses:  Rash      NEW  MEDICATIONS STARTED DURING THIS VISIT:  ED Discharge Orders        Ordered    ivermectin (STROMECTOL) 3 MG TABS tablet   Once     06/08/17 1925    fluconazole (DIFLUCAN) 150 MG tablet  Daily     06/08/17 1925    sulfamethoxazole-trimethoprim (BACTRIM DS,SEPTRA DS) 800-160 MG tablet  2 times daily     06/08/17 1925          This chart was dictated using voice recognition software/Dragon. Despite best efforts to proofread, errors can occur which can change the meaning. Any change was purely unintentional.    Orvil FeilWoods, Jaclyn M, PA-C 06/08/17 1956    Dionne BucySiadecki, Sebastian, MD 06/08/17 2024

## 2017-06-08 NOTE — ED Notes (Signed)
Pt discharged to home.  Family member driving.  Discharge instructions reviewed.  Verbalized understanding.  No questions or concerns at this time.  Teach back verified.  Pt in NAD.  No items left in ED.   

## 2017-06-08 NOTE — ED Triage Notes (Signed)
Pt reports that she has had a rash for 11 days. She has been to three different doctors. She has the rash all over. The first doctor gave her steroids and told her it was chicken pox. The second one told her to stop taking the steroids that it wasn't chicken pox. The third doctor told her that they dont know what it is. She reports that it is getting worse. She states that when she gets hot they spread.

## 2017-06-08 NOTE — ED Notes (Signed)
See triage note  Presents with a 1 1/2 week hx of generalized rash  Was on steroids but was told by another  MD to stop taking them

## 2017-06-10 LAB — RPR: RPR Ser Ql: NONREACTIVE
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
